# Patient Record
Sex: Male | Born: 1975 | Race: Black or African American | Hispanic: No | Marital: Single
Health system: Southern US, Community
[De-identification: ages and names within clinical notes are randomized; demographics above are authoritative.]

---

## 1998-07-01 ENCOUNTER — Emergency Department (HOSPITAL_COMMUNITY): Admission: EM | Admit: 1998-07-01 | Discharge: 1998-07-01 | Payer: Self-pay | Admitting: Emergency Medicine

## 2000-08-19 ENCOUNTER — Emergency Department (HOSPITAL_COMMUNITY): Admission: EM | Admit: 2000-08-19 | Discharge: 2000-08-19 | Payer: Self-pay | Admitting: Emergency Medicine

## 2006-02-01 ENCOUNTER — Encounter: Payer: Self-pay | Admitting: Emergency Medicine

## 2019-11-26 ENCOUNTER — Encounter (HOSPITAL_COMMUNITY): Payer: Self-pay | Admitting: Emergency Medicine

## 2019-11-26 ENCOUNTER — Emergency Department (HOSPITAL_COMMUNITY)
Admission: EM | Admit: 2019-11-26 | Discharge: 2019-11-26 | Disposition: A | Discharge status: Home / Self Care | Payer: No Typology Code available for payment source | Attending: Emergency Medicine | Admitting: Emergency Medicine

## 2019-11-26 ENCOUNTER — Emergency Department (HOSPITAL_COMMUNITY): Payer: No Typology Code available for payment source

## 2019-11-26 ENCOUNTER — Other Ambulatory Visit: Payer: Self-pay

## 2019-11-26 DIAGNOSIS — S161XXA Strain of muscle, fascia and tendon at neck level, initial encounter: Secondary | ICD-10-CM | POA: Diagnosis not present

## 2019-11-26 DIAGNOSIS — S39012A Strain of muscle, fascia and tendon of lower back, initial encounter: Secondary | ICD-10-CM | POA: Diagnosis not present

## 2019-11-26 DIAGNOSIS — Y92410 Unspecified street and highway as the place of occurrence of the external cause: Secondary | ICD-10-CM | POA: Insufficient documentation

## 2019-11-26 DIAGNOSIS — S199XXA Unspecified injury of neck, initial encounter: Secondary | ICD-10-CM | POA: Diagnosis present

## 2019-11-26 DIAGNOSIS — Y999 Unspecified external cause status: Secondary | ICD-10-CM | POA: Diagnosis not present

## 2019-11-26 DIAGNOSIS — Y939 Activity, unspecified: Secondary | ICD-10-CM | POA: Diagnosis not present

## 2019-11-26 MED ORDER — CYCLOBENZAPRINE HCL 10 MG PO TABS: 10.0000 mg | ORAL_TABLET | Freq: Three times a day (TID) | ORAL | 0 refills | Status: AC | PRN

## 2019-11-26 MED ORDER — IBUPROFEN 800 MG PO TABS: 800.0000 mg | ORAL_TABLET | Freq: Three times a day (TID) | ORAL | 0 refills | Status: AC | PRN
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
## 2019-11-26 NOTE — ED Notes (Signed)
Patient transported to X-ray 

## 2019-11-26 NOTE — Discharge Instructions (Signed)
Return here as needed.  Use ice and heat on your back and neck.  You can expect to be sore over the next 7 to 14 days.  The x-rays did not show any acute abnormalities.
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
